# Patient Record
Sex: Female | Born: 1985 | Race: Black or African American | Hispanic: No | Marital: Single | Smoking: Never smoker
Health system: Southern US, Community
[De-identification: ages and names within clinical notes are randomized; demographics above are authoritative.]

---

## 2011-09-28 ENCOUNTER — Emergency Department (INDEPENDENT_AMBULATORY_CARE_PROVIDER_SITE_OTHER)
Admission: EM | Admit: 2011-09-28 | Discharge: 2011-09-28 | Disposition: A | Discharge status: Home / Self Care | Payer: BC Managed Care – PPO | Source: Home / Self Care | Attending: Emergency Medicine | Admitting: Emergency Medicine

## 2011-09-28 ENCOUNTER — Encounter (HOSPITAL_COMMUNITY): Payer: Self-pay | Admitting: *Deleted

## 2011-09-28 DIAGNOSIS — J039 Acute tonsillitis, unspecified: Secondary | ICD-10-CM

## 2011-09-28 MED ORDER — PENICILLIN V POTASSIUM 500 MG PO TABS: 500.0000 mg | ORAL_TABLET | Freq: Three times a day (TID) | ORAL | Status: AC

## 2011-09-28 NOTE — Discharge Instructions (Signed)
Tonsillitis Tonsils are lumps of lymphoid tissues at the back of the throat. Each tonsil has 20 crevices (crypts). Tonsils help fight nose and throat infections and keep infection from spreading to other parts of the body for the first 18 months of life. Tonsillitis is an infection of the throat that causes the tonsils to become red, tender, and swollen. CAUSES Sudden and, if treated, temporary (acute) tonsillitis is usually caused by infection with streptococcal bacteria. Long lasting (chronic) tonsillitis occurs when the crypts of the tonsils become filled with pieces of food and bacteria, which makes it easy for the tonsils to become constantly infected. SYMPTOMS  Symptoms of tonsillitis include:  A sore throat.   White patches on the tonsils.   Fever.   Tiredness.  DIAGNOSIS Tonsillitis can be diagnosed through a physical exam. Diagnosis can be confirmed with the results of lab tests, including a throat culture. TREATMENT  The goals of tonsillitis treatment include the reduction of the severity and duration of symptoms, prevention of associated conditions, and prevention of disease transmission. Tonsillitis caused by bacteria can be treated with antibiotics. Usually, treatment with antibiotics is started before the cause of the tonsillitis is known. However, if it is determined that the cause is not bacterial, antibiotics will not treat the tonsillitis. If attacks of tonsillitis are severe and frequent, your caregiver may recommend surgery to remove the tonsils (tonsillectomy). HOME CARE INSTRUCTIONS   Rest as much as possible and get plenty of sleep.   Drink plenty of fluids. While the throat is very sore, eat soft foods or liquids, such as sherbet, soups, or instant breakfast drinks.   Eat frozen ice pops.   Older children and adults may gargle with a warm or cold liquid to help soothe the throat. Mix 1 teaspoon of salt in 1 cup of water.   Other family members who also develop a  sore throat or fever should have a medical exam or throat culture.   Only take over-the-counter or prescription medicines for pain, discomfort, or fever as directed by your caregiver.  SEEK MEDICAL CARE IF:   Your baby is older than 3 months with a rectal temperature of 100.5 F (38.1 C) or higher for more than 1 day.   Large, tender lumps develop in your neck.   A rash develops.   Green, yellow-brown, or bloody substance is coughed up.   You are unable to swallow liquids or food for 24 hours.   Your child is unable to swallow food or liquids for 12 hours.  SEEK IMMEDIATE MEDICAL CARE IF:   You develop any new symptoms such as vomiting, severe headache, stiff neck, chest pain, or trouble breathing or swallowing.   You have severe throat pain along with drooling or voice changes.   You have severe pain, unrelieved with recommended medications.   You are unable to fully open the mouth.   You develop redness, swelling, or severe pain anywhere in the neck.   You have a fever.   Your baby is older than 3 months with a rectal temperature of 102 F (38.9 C) or higher.   Your baby is 6 months old or younger with a rectal temperature of 100.4 F (38 C) or higher.  MAKE SURE YOU:   Understand these instructions.   Will watch your condition.   Will get help right away if you are not doing well or get worse.  Document Released: 04/26/2005 Document Revised: 03/29/2011 Document Reviewed: 09/22/2010 [facility] Patient Information 2012 [place]  Most upper respiratory infections are caused by viruses and do not require antibiotics.  We try to save the antibiotics for when we really need them to avoid resistance.  This does not mean that there is nothing that can be done.  Here are a few hints about things that can be done at home to get over an upper respiratory infection quicker:  Get extra sleep and extra fluids.  Get 7 to 9 hours of sleep per night and 6 to 8 glasses of water  a day.  Getting extra sleep keeps the immune system from getting run down.  Most people with an upper respiratory infection are a little dehydrated.  The extra fluids also keep the secretions liquified and easier to deal with.  Also, get extra vitamin C.  4000 mg per day is the recommended dose. For the aches, headache, and fever, acetaminophen or ibuprofen are helpful.  These can be alternated every 4 hours.  People with liver disease should avoid large amounts of acetaminophen, and people with ulcer disease, gastroesophageal reflux, gastritis, congestive heart failure, chronic kidney disease, coronary artery disease and the elderly should avoid ibuprofen. For nasal congestion try Mucinex-D, or if you're having lots of sneezing or copious clear nasal drainage Allegra-D-24 hour.  A Saline nasal spray such as Ocean Spray can also help as can decongestant sprays such as Afrin, but you should not use the decongestant sprays for more than 3 or 4 days since they can be habituating.  If nasal dryness is a problem, Ayr Nasal Gel can help moisturize your nasal passages.  Breath Rite nasal strips can also offer a non-drug alternative treatment to nasal congestion, especially at night. For people with symptoms of sinusitis, sleeping with your head elevated can be helpful.  For sinus pain, moist, hot compresses to the face may provide some relief.  Many people find that inhaling steam as in a shower or from a pot of steaming water can help. For sore throat, zinc containing lozenges such as Cold-Eze or Zicam are helpful.  Zinc helps to fight infection and has a mild astringent effect that relieves the sore, achey throat.  Hot salt water gargles (8 oz of hot water, 1/2 tsp of table salt, and a pinch of baking soda) can give relief as well as hot beverages such as hot tea. For the cough, old time remedies such as honey or honey and lemon are tried and true.  Over the counter cough syrups such as Delsym 2 tsp every 12 hours  can help as well.  It's important when you have an upper respiratory infection not to pass the infection to others.  This involves being very careful about the following:  Frequent hand washing or use of hand sanitizer, especially after coughing, sneezing, blowing your nose or touching your face, nose or eyes. Do not shake hands or touch anyone and try to avoid touching surfaces that other people use such as doorknobs, shopping carts, telephones and computer keyboards. Use tissues and dispose of them properly in a garbage can or ziplock bag. Cough into your sleeve. Do not let others eat or drink after you.  It's also important to recognize the signs of serious illness and get evaluated if they occur: Any respiratory infection that lasts more than 7 to 10 days.  Yellow nasal drainage and sputum are not reliable indicators of a bacterial infection, but if they last for more than 1 week, see your doctor. Fever and sore throat can  indicate strep. Fever and cough can indicate influenza or pneumonia. Any kind of severe symptom such as difficulty breathing, intractable vomiting, or severe pain should prompt you to see a doctor as soon as possible.   Your body's immune system is really the thing that will get rid of this infection.  Your immune system is comprised of 2 types of specialized cells called T cells and B cells.  T cells coordinate the array of cells in your body that engulf invading bacteria or viruses while B cells orchestrate the production of antibodies that neutralize infection.  Anything we do or any medications we give you, will just strengthen your immune system or help it clear up the infection quicker.  Here are a few helpful hints to improve your immune system to help overcome this illness or to prevent future infections:  A few vitamins can improve the health of your immune system.  That's why your diet should include plenty of fruits, vegetables, fish, nuts, and whole  grains.  Vitamin A and bet-carotene can increase the cells that fight infections (T cells and B cells).  Vitamin A is abundant in dark greens and orange vegetables such as spinach, greens, sweet potatoes, and carrots.  Vitamin B6 contributes to the maturation of white blood cells, the cells that fight disease.  Foods with vitamin B6 include cold cereal and bananas.  Vitamin C is credited with preventing colds because it increases white blood cells and also prevents cellular damage.  Citrus fruits, peaches and green and red bell peppers are all hight in vitamin C.  Vitamin E is an anti-oxidant that encourages the production of natural killer cells which reject foreign invaders and B cells that produce antibodies.  Foods high in vitamin E include wheat germ, nuts and seeds.  Foods high in omega-3 fatty acids found in foods like salmon, tuna and mackerel boost your immune system and help cells to engulf and absorb germs.  Probiotics are good bacteria that increase your T cells.  These can be found in yogurt and are available in supplements such as Culturelle or Align.  Moderate exercise increases the strength of your immune system and your ability to recover from illness.  I suggest 3 to 5 moderate intensity 30 minute workouts per week.    Sleep is another component of maintaining a strong immune system.  It enables your body to recuperate from the day's activities, stress and work.  My recommendation is to get between 7 and 9 hours of sleep per night.  If you smoke, try to quit completely or at least cut down.  Drink alcohol only in moderation if at all.  No more than 2 drinks daily for men or 1 for women.  Get a flu vaccine early in the fall or if you have not gotten one yet, once this illness has run its course.  If you are over 65, a smoker, or an asthmatic, get a pneumococcal vaccine.  My final recommendation is to maintain a healthy weight.  Excess weight can impair the immune system by  interfering with the way the immune system deals with invading viruses or bacteria.

## 2011-09-28 NOTE — ED Notes (Signed)
Pt  Reports  Symptoms  Of  sorethroat  With  Discomfort  When  She  Swallows  -  The  Pt reports  She  Developed  The  Symptoms   Yesterday   -   The  Pt    Reports  She  Has  Not  Attempted  Any otc   meds  Or treatments -   The pt is  Sitting  Upright on the  Exam table  Speaking in complete  sentances

## 2011-09-28 NOTE — ED Provider Notes (Signed)
Chief Complaint  Patient presents with  . Sore Throat    History of Present Illness:   The patient is a 26 year old female who has had a two-day history of a sore throat and pain with swallowing. She denies any fever, chills, headache, nasal congestion, rhinorrhea and she's had no neck pain or swollen glands, coughing, vomiting, diarrhea, or abdominal pain. No exposure to strep or to mono.  Review of Systems:  Other than noted above, the patient denies any of the following symptoms. Systemic:  No fever, chills, sweats, fatigue, myalgias, headache, or anorexia. Eye:  No redness, pain or drainage. ENT:  No earache, nasal congestion, rhinorrhea, sinus pressure, or sore throat. Lungs:  No cough, sputum production, wheezing, shortness of breath. Or chest pain. GI:  No nausea, vomiting, abdominal pain or diarrhea. Skin:  No rash or itching.  PMFSH:  Past medical history, family history, social history, meds, and allergies were reviewed.  Physical Exam:   Vital signs:  BP 124/80  Pulse 94  Temp(Src) 98.5 F (36.9 C) (Oral)  Resp 16  SpO2 100% General:  Alert, in no distress. Eye:  No conjunctival injection or drainage. ENT:  TMs and canals were normal, without erythema or inflammation.  Nasal mucosa was clear and uncongested, without drainage.  Mucous membranes were moist.  Tonsils were enlarged, red, and covered with spots of whitish exudate.  There were no oral ulcerations or lesions. Neck:  Supple, no adenopathy, tenderness or mass. Lungs:  No respiratory distress.  Lungs were clear to auscultation, without wheezes, rales or rhonchi.  Breath sounds were clear and equal bilaterally. Heart:  Regular rhythm, without gallops, murmers or rubs. Skin:  Clear, warm, and dry, without rash or lesions.  Labs:   Results for orders placed during the hospital encounter of 09/28/11  POCT RAPID STREP A ([facility] URG CARE ONLY)      Component Value Range   Streptococcus, Group A Screen (Direct) NEGATIVE   NEGATIVE   POCT INFECTIOUS MONO SCREEN      Component Value Range   Mono Screen NEGATIVE  NEGATIVE      Radiology:  No results found.  Assessment:   Diagnoses that have been ruled out:  None  Diagnoses that are still under consideration:  None  Final diagnoses:  Tonsillitis      Plan:   1.  The following meds were prescribed:   New Prescriptions   PENICILLIN V POTASSIUM (VEETID) 500 MG TABLET    Take 1 tablet (500 mg total) by mouth 3 (three) times daily.   2.  The patient was instructed in symptomatic care and handouts were given. 3.  The patient was told to return if becoming worse in any way, if no better in 3 or 4 days, and given some red flag symptoms that would indicate earlier return.   Roque Lias, MD 09/28/11 [phone]

## 2012-04-30 ENCOUNTER — Encounter: Payer: BC Managed Care – PPO | Admitting: Obstetrics and Gynecology

## 2012-10-02 ENCOUNTER — Encounter: Payer: Self-pay | Admitting: Obstetrics and Gynecology

## 2012-10-02 NOTE — Progress Notes (Unsigned)
Fax received for RF Microgestin. Pt has not had recent annual. Rx denied.

## 2015-04-10 ENCOUNTER — Emergency Department (HOSPITAL_COMMUNITY): Payer: PRIVATE HEALTH INSURANCE

## 2015-04-10 ENCOUNTER — Encounter (HOSPITAL_COMMUNITY): Payer: Self-pay | Admitting: *Deleted

## 2015-04-10 ENCOUNTER — Emergency Department (HOSPITAL_COMMUNITY)
Admission: EM | Admit: 2015-04-10 | Discharge: 2015-04-10 | Disposition: A | Discharge status: Home / Self Care | Payer: PRIVATE HEALTH INSURANCE | Attending: Emergency Medicine | Admitting: Emergency Medicine

## 2015-04-10 DIAGNOSIS — S29001A Unspecified injury of muscle and tendon of front wall of thorax, initial encounter: Secondary | ICD-10-CM | POA: Diagnosis present

## 2015-04-10 DIAGNOSIS — Y9389 Activity, other specified: Secondary | ICD-10-CM | POA: Insufficient documentation

## 2015-04-10 DIAGNOSIS — M7918 Myalgia, other site: Secondary | ICD-10-CM

## 2015-04-10 DIAGNOSIS — F419 Anxiety disorder, unspecified: Secondary | ICD-10-CM | POA: Insufficient documentation

## 2015-04-10 DIAGNOSIS — Y92410 Unspecified street and highway as the place of occurrence of the external cause: Secondary | ICD-10-CM | POA: Insufficient documentation

## 2015-04-10 DIAGNOSIS — R9431 Abnormal electrocardiogram [ECG] [EKG]: Secondary | ICD-10-CM | POA: Diagnosis not present

## 2015-04-10 DIAGNOSIS — Y998 Other external cause status: Secondary | ICD-10-CM | POA: Diagnosis not present

## 2015-04-10 MED ORDER — IBUPROFEN 400 MG PO TABS
600.0000 mg | ORAL_TABLET | Freq: Once | ORAL | Status: AC
  Administered 2015-04-10: 600 mg via ORAL
  Filled 2015-04-10: qty 2

## 2015-04-10 MED ORDER — IBUPROFEN 800 MG PO TABS: 800.0000 mg | ORAL_TABLET | Freq: Three times a day (TID) | ORAL | Status: AC | PRN

## 2015-04-10 MED ORDER — CYCLOBENZAPRINE HCL 10 MG PO TABS: 10.0000 mg | ORAL_TABLET | Freq: Three times a day (TID) | ORAL | Status: DC | PRN
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
## 2015-04-22 ENCOUNTER — Ambulatory Visit: Payer: PRIVATE HEALTH INSURANCE | Admitting: Family

## 2015-05-25 ENCOUNTER — Other Ambulatory Visit (HOSPITAL_COMMUNITY): Payer: Self-pay | Admitting: Family Medicine

## 2015-05-25 ENCOUNTER — Telehealth (HOSPITAL_COMMUNITY): Payer: Self-pay | Admitting: *Deleted

## 2015-05-25 DIAGNOSIS — R011 Cardiac murmur, unspecified: Secondary | ICD-10-CM

## 2015-06-03 ENCOUNTER — Other Ambulatory Visit: Payer: Self-pay

## 2015-06-03 ENCOUNTER — Ambulatory Visit (HOSPITAL_COMMUNITY): Payer: PRIVATE HEALTH INSURANCE | Attending: Internal Medicine

## 2015-06-03 DIAGNOSIS — I313 Pericardial effusion (noninflammatory): Secondary | ICD-10-CM | POA: Insufficient documentation

## 2015-06-03 DIAGNOSIS — I10 Essential (primary) hypertension: Secondary | ICD-10-CM | POA: Insufficient documentation

## 2015-06-03 DIAGNOSIS — R011 Cardiac murmur, unspecified: Secondary | ICD-10-CM | POA: Diagnosis present
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
## 2023-08-18 ENCOUNTER — Telehealth: Payer: Self-pay

## 2023-08-18 DIAGNOSIS — J069 Acute upper respiratory infection, unspecified: Secondary | ICD-10-CM

## 2023-08-19 ENCOUNTER — Ambulatory Visit: Payer: Self-pay

## 2023-08-19 MED ORDER — PREDNISONE 10 MG (21) PO TBPK: ORAL_TABLET | ORAL | 0 refills | Status: DC

## 2023-08-19 MED ORDER — BENZONATATE 100 MG PO CAPS: 100.0000 mg | ORAL_CAPSULE | Freq: Two times a day (BID) | ORAL | 0 refills | Status: DC | PRN

## 2023-08-19 NOTE — Progress Notes (Signed)
I have spent 5 minutes in review of e-visit questionnaire, review and updating patient chart, medical decision making and response to patient.   Laure Kidney, PA-C

## 2023-08-19 NOTE — Progress Notes (Signed)
 E-Visit for Cough   We are sorry that you are not feeling well.  Here is how we plan to help!  Based on your presentation I believe you most likely have A cough due to a virus.  This is called viral bronchitis and is best treated by rest, plenty of fluids and control of the cough.  You may use Ibuprofen or Tylenol as directed to help your symptoms.     In addition you may use A prescription cough medication called Tessalon Perles 100mg . You may take 1-2 capsules every 8 hours as needed for your cough.   Directions for 6 day taper: Day 1: 2 tablets before breakfast, 1 after both lunch & dinner and 2 at bedtime Day 2: 1 tab before breakfast, 1 after both lunch & dinner and 2 at bedtime Day 3: 1 tab at each meal & 1 at bedtime Day 4: 1 tab at breakfast, 1 at lunch, 1 at bedtime Day 5: 1 tab at breakfast & 1 tab at bedtime Day 6: 1 tab at breakfast  From your responses in the eVisit questionnaire you describe inflammation in the upper respiratory tract which is causing a significant cough.  This is commonly called Bronchitis and has four common causes:   Allergies Viral Infections Acid Reflux Bacterial Infection Allergies, viruses and acid reflux are treated by controlling symptoms or eliminating the cause. An example might be a cough caused by taking certain blood pressure medications. You stop the cough by changing the medication. Another example might be a cough caused by acid reflux. Controlling the reflux helps control the cough.  USE OF BRONCHODILATOR ("RESCUE") INHALERS: There is a risk from using your bronchodilator too frequently.  The risk is that over-reliance on a medication which only relaxes the muscles surrounding the breathing tubes can reduce the effectiveness of medications prescribed to reduce swelling and congestion of the tubes themselves.  Although you feel brief relief from the bronchodilator inhaler, your asthma may actually be worsening with the tubes becoming more  swollen and filled with mucus.  This can delay other crucial treatments, such as oral steroid medications. If you need to use a bronchodilator inhaler daily, several times per day, you should discuss this with your provider.  There are probably better treatments that could be used to keep your asthma under control.     HOME CARE Only take medications as instructed by your medical team. Complete the entire course of an antibiotic. Drink plenty of fluids and get plenty of rest. Avoid close contacts especially the very young and the elderly Cover your mouth if you cough or cough into your sleeve. Always remember to wash your hands A steam or ultrasonic humidifier can help congestion.   GET HELP RIGHT AWAY IF: You develop worsening fever. You become short of breath You cough up blood. Your symptoms persist after you have completed your treatment plan MAKE SURE YOU  Understand these instructions. Will watch your condition. Will get help right away if you are not doing well or get worse.    Thank you for choosing an e-visit.  Your e-visit answers were reviewed by a board certified advanced clinical practitioner to complete your personal care plan. Depending upon the condition, your plan could have included both over the counter or prescription medications.  Please review your pharmacy choice. Make sure the pharmacy is open so you can pick up prescription now. If there is a problem, you may contact your provider through [employer] and have the  prescription routed to another pharmacy.  Your safety is important to [place]. If you have drug allergies check your prescription carefully.   For the next 24 hours you can use MyChart to ask questions about today's visit, request a non-urgent call back, or ask for a work or school excuse. You will get an email in the next two days asking about your experience. I hope that your e-visit has been valuable and will speed your recovery.

## 2023-09-30 ENCOUNTER — Telehealth: Admitting: Family Medicine

## 2023-09-30 DIAGNOSIS — J4540 Moderate persistent asthma, uncomplicated: Secondary | ICD-10-CM

## 2023-09-30 MED ORDER — BENZONATATE 200 MG PO CAPS: 200.0000 mg | ORAL_CAPSULE | Freq: Two times a day (BID) | ORAL | 0 refills | Status: DC | PRN

## 2023-09-30 MED ORDER — PREDNISONE 20 MG PO TABS: 20.0000 mg | ORAL_TABLET | Freq: Two times a day (BID) | ORAL | 0 refills | Status: AC

## 2023-09-30 NOTE — Progress Notes (Signed)
 E-Visit for Cough   We are sorry that you are not feeling well.  Here is how we plan to help!  Based on your presentation I believe you most likely have A cough due to a virus.  This is called viral bronchitis and is best treated by rest, plenty of fluids and control of the cough.  You may use Ibuprofen or Tylenol as directed to help your symptoms.     In addition you may use A prescription cough medication called Tessalon Perles 100mg . You may take 1-2 capsules every 8 hours as needed for your cough.  Prednisone also sent.   From your responses in the eVisit questionnaire you describe inflammation in the upper respiratory tract which is causing a significant cough.  This is commonly called Bronchitis and has four common causes:   Allergies Viral Infections Acid Reflux Bacterial Infection Allergies, viruses and acid reflux are treated by controlling symptoms or eliminating the cause. An example might be a cough caused by taking certain blood pressure medications. You stop the cough by changing the medication. Another example might be a cough caused by acid reflux. Controlling the reflux helps control the cough.  USE OF BRONCHODILATOR ("RESCUE") INHALERS: There is a risk from using your bronchodilator too frequently.  The risk is that over-reliance on a medication which only relaxes the muscles surrounding the breathing tubes can reduce the effectiveness of medications prescribed to reduce swelling and congestion of the tubes themselves.  Although you feel brief relief from the bronchodilator inhaler, your asthma may actually be worsening with the tubes becoming more swollen and filled with mucus.  This can delay other crucial treatments, such as oral steroid medications. If you need to use a bronchodilator inhaler daily, several times per day, you should discuss this with your provider.  There are probably better treatments that could be used to keep your asthma under control.     HOME  CARE Only take medications as instructed by your medical team. Complete the entire course of an antibiotic. Drink plenty of fluids and get plenty of rest. Avoid close contacts especially the very young and the elderly Cover your mouth if you cough or cough into your sleeve. Always remember to wash your hands A steam or ultrasonic humidifier can help congestion.   GET HELP RIGHT AWAY IF: You develop worsening fever. You become short of breath You cough up blood. Your symptoms persist after you have completed your treatment plan MAKE SURE YOU  Understand these instructions. Will watch your condition. Will get help right away if you are not doing well or get worse.    Thank you for choosing an e-visit.  Your e-visit answers were reviewed by a board certified advanced clinical practitioner to complete your personal care plan. Depending upon the condition, your plan could have included both over the counter or prescription medications.  Please review your pharmacy choice. Make sure the pharmacy is open so you can pick up prescription now. If there is a problem, you may contact your provider through [employer] and have the prescription routed to another pharmacy.  Your safety is important to [place]. If you have drug allergies check your prescription carefully.   For the next 24 hours you can use MyChart to ask questions about today's visit, request a non-urgent call back, or ask for a work or school excuse. You will get an email in the next two days asking about your experience. I hope that your e-visit has been valuable and  will speed your recovery.    have provided 5 minutes of non face to face time during this encounter for chart review and documentation.

## 2023-11-21 DIAGNOSIS — N898 Other specified noninflammatory disorders of vagina: Secondary | ICD-10-CM | POA: Diagnosis not present

## 2023-11-21 DIAGNOSIS — Z1331 Encounter for screening for depression: Secondary | ICD-10-CM | POA: Diagnosis not present

## 2023-11-21 DIAGNOSIS — Z118 Encounter for screening for other infectious and parasitic diseases: Secondary | ICD-10-CM | POA: Diagnosis not present

## 2023-11-21 DIAGNOSIS — Z01419 Encounter for gynecological examination (general) (routine) without abnormal findings: Secondary | ICD-10-CM | POA: Diagnosis not present

## 2024-01-31 ENCOUNTER — Ambulatory Visit: Admitting: Internal Medicine

## 2024-01-31 NOTE — Progress Notes (Deleted)
  [facility] [address] [place] [address] Dept: [phone] Dept Fax: [phone]  New Patient Office Visit  Subjective:   Carla Brewer 03-09-1986 01/31/2024  No chief complaint on file.   HPI: Carla Brewer presents today to establish care at [facility] [employer]. Introduced to Publishing rights manager role and practice setting.  All questions answered.  Concerns: See below      The following portions of the patient's history were reviewed and updated as appropriate: past medical history, past surgical history, family history, social history, allergies, medications, and problem list.   There are no active problems to display for this patient.  No past medical history on file. No past surgical history on file. No family history on file.  Current Outpatient Medications:    benzonatate  (TESSALON ) 100 MG capsule, Take 1 capsule (100 mg total) by mouth 2 (two) times daily as needed for cough., Disp: 20 capsule, Rfl: 0   benzonatate  (TESSALON ) 200 MG capsule, Take 1 capsule (200 mg total) by mouth 2 (two) times daily as needed for cough., Disp: 20 capsule, Rfl: 0   cyclobenzaprine  (FLEXERIL ) 10 MG tablet, Take 1 tablet (10 mg total) by mouth 3 (three) times daily as needed for muscle spasms., Disp: 10 tablet, Rfl: 0   ibuprofen  (ADVIL ,MOTRIN ) 800 MG tablet, Take 1 tablet (800 mg total) by mouth every 8 (eight) hours as needed for mild pain or moderate pain., Disp: 15 tablet, Rfl: 0   predniSONE  (STERAPRED UNI-PAK 21 TAB) 10 MG (21) TBPK tablet, Take as prescribed on packaging, Disp: 1 each, Rfl: 0 No Known Allergies  ROS: A complete ROS was performed with pertinent positives/negatives noted in the HPI. The remainder of the ROS are negative.   Objective:   There were no vitals filed for this visit.  GENERAL: Well-appearing, in NAD. Well nourished.  SKIN: Pink, warm and dry. No rash, lesion,  ulceration, or ecchymoses.  NECK: Trachea midline. Full ROM w/o pain or tenderness. No lymphadenopathy.  RESPIRATORY: Chest wall symmetrical. Respirations even and non-labored. Breath sounds clear to auscultation bilaterally.  CARDIAC: S1, S2 present, regular rate and rhythm. Peripheral pulses 2+ bilaterally.  MSK: Muscle tone and strength appropriate for age. Joints w/o tenderness, redness, or swelling.  EXTREMITIES: Without clubbing, cyanosis, or edema.  NEUROLOGIC: No motor or sensory deficits. Steady, even gait.  PSYCH/MENTAL STATUS: Alert, oriented x 3. Cooperative, appropriate mood and affect.   Health Maintenance Due  Topic Date Due   HIV Screening  Never done   Hepatitis C Screening  Never done   Pneumococcal Vaccine 45-22 Years old (1 of 2 - PCV) Never done   Hepatitis B Vaccines (1 of 3 - 19+ 3-dose series) Never done   HPV VACCINES (3 - 3-dose series) 04/28/2007   DTaP/Tdap/Td (8 - Td or Tdap) 10/25/2016   Cervical Cancer Screening (HPV/Pap Cotest)  Never done   COVID-19 Vaccine (1 - 2024-25 season) Never done    No results found for any visits on 01/31/24.  Assessment & Plan:   No orders of the defined types were placed in this encounter.  No orders of the defined types were placed in this encounter.   No follow-ups on file.   Rosina Senters, FNP

## 2024-03-19 DIAGNOSIS — M549 Dorsalgia, unspecified: Secondary | ICD-10-CM | POA: Diagnosis not present

## 2024-03-19 DIAGNOSIS — S29012A Strain of muscle and tendon of back wall of thorax, initial encounter: Secondary | ICD-10-CM | POA: Diagnosis not present

## 2024-05-23 DIAGNOSIS — S76011A Strain of muscle, fascia and tendon of right hip, initial encounter: Secondary | ICD-10-CM | POA: Diagnosis not present

## 2024-05-23 DIAGNOSIS — M25551 Pain in right hip: Secondary | ICD-10-CM | POA: Diagnosis not present

## 2024-09-04 ENCOUNTER — Ambulatory Visit
Admission: RE | Admit: 2024-09-04 | Discharge: 2024-09-04 | Disposition: A | Discharge status: Home / Self Care | Source: Ambulatory Visit

## 2024-09-04 VITALS — BP 154/103 | HR 98 | Temp 98.2°F | Resp 17

## 2024-09-04 DIAGNOSIS — S46811A Strain of other muscles, fascia and tendons at shoulder and upper arm level, right arm, initial encounter: Secondary | ICD-10-CM

## 2024-09-04 MED ORDER — BACLOFEN 5 MG PO TABS: 1.0000 | ORAL_TABLET | Freq: Three times a day (TID) | ORAL | 0 refills | Status: AC | PRN

## 2024-09-04 MED ORDER — METHYLPREDNISOLONE 4 MG PO TBPK: ORAL_TABLET | Freq: Every day | ORAL | 0 refills | Status: AC

## 2024-09-04 NOTE — Discharge Instructions (Addendum)
-   You can take the muscle relaxer Baclofen  up to 3 times daily for muscle spasms and pain.  This medication will make you drowsy, so take it when you are home and do not need to drive. - Medrol  Dosepak.  This is a steroid that will reduce the inflammation in your back.  Try to take with food.  Limit NSAIDs like ibuprofen , Aleve, naproxen while taking this medication.  You can still take Tylenol. -Purchase lidocaine patches over-the-counter. Apply 1 lidocaine patch to the affected area up to every 12 hours.  Do not use a heating pad on top of the lidocaine patch, because this can make it release its medication too quickly. -Heating pad, gentle stretching -Try to limit heavy lifting while symptoms persist -Follow-up with us , PCP, or orthopedist if symptoms persist in 1 week, or new symptoms like pain shooting down your arm.

## 2024-09-04 NOTE — ED Triage Notes (Addendum)
 Pt c/o right upper back pain that radiates into shoulder since Monday.  Pt states several months ago she had a pop in right shoulder/back and pain for a few weeks after which eventually got better. [place] do some heavy lifting
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
